# Patient Record
Sex: Male | Born: 1970
Health system: Southern US, Community
[De-identification: ages and names within clinical notes are randomized; demographics above are authoritative.]

## PROBLEM LIST (undated history)

## (undated) DIAGNOSIS — M5126 Other intervertebral disc displacement, lumbar region: Secondary | ICD-10-CM

## (undated) HISTORY — PX: WISDOM TOOTH EXTRACTION: SHX21

## (undated) HISTORY — PX: FOOT SURGERY: SHX648

---

## 2008-09-09 ENCOUNTER — Encounter: Admission: RE | Admit: 2008-09-09 | Discharge: 2008-09-09 | Payer: Self-pay | Admitting: Family Medicine

## 2010-11-08 ENCOUNTER — Emergency Department (INDEPENDENT_AMBULATORY_CARE_PROVIDER_SITE_OTHER): Payer: Worker's Compensation

## 2010-11-08 ENCOUNTER — Emergency Department (HOSPITAL_BASED_OUTPATIENT_CLINIC_OR_DEPARTMENT_OTHER)
Admission: EM | Admit: 2010-11-08 | Discharge: 2010-11-08 | Disposition: A | Discharge status: Home / Self Care | Payer: Worker's Compensation | Attending: Emergency Medicine | Admitting: Emergency Medicine

## 2010-11-08 ENCOUNTER — Encounter: Payer: Self-pay | Admitting: *Deleted

## 2010-11-08 DIAGNOSIS — M19019 Primary osteoarthritis, unspecified shoulder: Secondary | ICD-10-CM

## 2010-11-08 DIAGNOSIS — M25519 Pain in unspecified shoulder: Secondary | ICD-10-CM

## 2010-11-08 DIAGNOSIS — S40029A Contusion of unspecified upper arm, initial encounter: Secondary | ICD-10-CM | POA: Insufficient documentation

## 2010-11-08 DIAGNOSIS — S40019A Contusion of unspecified shoulder, initial encounter: Secondary | ICD-10-CM | POA: Insufficient documentation

## 2010-11-08 MED ORDER — IBUPROFEN 800 MG PO TABS: 800.0000 mg | ORAL_TABLET | Freq: Three times a day (TID) | ORAL | Status: AC

## 2010-11-08 MED ORDER — IBUPROFEN 800 MG PO TABS
ORAL_TABLET | ORAL | Status: AC
  Administered 2010-11-08: 800 mg
  Filled 2010-11-08: qty 1
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
## 2010-11-08 NOTE — ED Notes (Signed)
Sling applied to L arm shoulder with increased comfort
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
## 2010-11-08 NOTE — ED Notes (Signed)
Sling applied increased comfort with ice pack

## 2015-12-13 DIAGNOSIS — R7309 Other abnormal glucose: Secondary | ICD-10-CM | POA: Diagnosis not present

## 2015-12-13 DIAGNOSIS — E781 Pure hyperglyceridemia: Secondary | ICD-10-CM | POA: Diagnosis not present

## 2015-12-29 DIAGNOSIS — H52203 Unspecified astigmatism, bilateral: Secondary | ICD-10-CM | POA: Diagnosis not present

## 2017-01-16 ENCOUNTER — Encounter (HOSPITAL_BASED_OUTPATIENT_CLINIC_OR_DEPARTMENT_OTHER): Payer: Self-pay

## 2017-01-16 ENCOUNTER — Emergency Department (HOSPITAL_BASED_OUTPATIENT_CLINIC_OR_DEPARTMENT_OTHER): Payer: BLUE CROSS/BLUE SHIELD

## 2017-01-16 ENCOUNTER — Emergency Department (HOSPITAL_BASED_OUTPATIENT_CLINIC_OR_DEPARTMENT_OTHER)
Admission: EM | Admit: 2017-01-16 | Discharge: 2017-01-16 | Disposition: A | Discharge status: Home / Self Care | Payer: BLUE CROSS/BLUE SHIELD | Attending: Emergency Medicine | Admitting: Emergency Medicine

## 2017-01-16 DIAGNOSIS — R109 Unspecified abdominal pain: Secondary | ICD-10-CM | POA: Diagnosis not present

## 2017-01-16 DIAGNOSIS — M545 Low back pain, unspecified: Secondary | ICD-10-CM

## 2017-01-16 DIAGNOSIS — E860 Dehydration: Secondary | ICD-10-CM | POA: Insufficient documentation

## 2017-01-16 LAB — URINALYSIS, ROUTINE W REFLEX MICROSCOPIC
BILIRUBIN URINE: NEGATIVE
GLUCOSE, UA: NEGATIVE mg/dL
Hgb urine dipstick: NEGATIVE
Ketones, ur: NEGATIVE mg/dL
Leukocytes, UA: NEGATIVE
NITRITE: NEGATIVE
Protein, ur: NEGATIVE mg/dL
SPECIFIC GRAVITY, URINE: 1.025 (ref 1.005–1.030)
pH: 6.5 (ref 5.0–8.0)

## 2017-01-16 LAB — CBC WITH DIFFERENTIAL/PLATELET
Basophils Absolute: 0.1 10*3/uL (ref 0.0–0.1)
Basophils Relative: 0 %
Eosinophils Absolute: 0.2 10*3/uL (ref 0.0–0.7)
Eosinophils Relative: 2 %
HCT: 45.3 % (ref 39.0–52.0)
Hemoglobin: 16.7 g/dL (ref 13.0–17.0)
Lymphocytes Relative: 34 %
Lymphs Abs: 3.9 10*3/uL (ref 0.7–4.0)
MCH: 30.2 pg (ref 26.0–34.0)
MCHC: 36.9 g/dL — ABNORMAL HIGH (ref 30.0–36.0)
MCV: 81.9 fL (ref 78.0–100.0)
Monocytes Absolute: 0.8 10*3/uL (ref 0.1–1.0)
Monocytes Relative: 7 %
Neutro Abs: 6.6 10*3/uL (ref 1.7–7.7)
Neutrophils Relative %: 58 %
Platelets: 236 10*3/uL (ref 150–400)
RBC: 5.53 MIL/uL (ref 4.22–5.81)
RDW: 14.3 % (ref 11.5–15.5)
WBC: 11.4 10*3/uL — ABNORMAL HIGH (ref 4.0–10.5)

## 2017-01-16 LAB — BASIC METABOLIC PANEL
Anion gap: 8 (ref 5–15)
BUN: 22 mg/dL — ABNORMAL HIGH (ref 6–20)
CO2: 26 mmol/L (ref 22–32)
Calcium: 9.2 mg/dL (ref 8.9–10.3)
Chloride: 105 mmol/L (ref 101–111)
Creatinine, Ser: 1.31 mg/dL — ABNORMAL HIGH (ref 0.61–1.24)
GFR calc Af Amer: >60 mL/min (ref >60)
GFR calc non Af Amer: >60 mL/min (ref >60)
Glucose, Bld: 124 mg/dL — ABNORMAL HIGH (ref 65–99)
Potassium: 3.8 mmol/L (ref 3.5–5.1)
Sodium: 139 mmol/L (ref 135–145)

## 2017-01-16 MED ORDER — CYCLOBENZAPRINE HCL 10 MG PO TABS: 10.0000 mg | ORAL_TABLET | Freq: Every day | ORAL | 0 refills | Status: DC

## 2017-01-16 MED ORDER — MORPHINE SULFATE (PF) 4 MG/ML IV SOLN
4.0000 mg | Freq: Once | INTRAVENOUS | Status: AC
  Administered 2017-01-16: 4 mg via INTRAVENOUS
  Filled 2017-01-16: qty 1

## 2017-01-16 MED ORDER — KETOROLAC TROMETHAMINE 30 MG/ML IJ SOLN
30.0000 mg | Freq: Once | INTRAMUSCULAR | Status: AC
  Administered 2017-01-16: 30 mg via INTRAVENOUS
  Filled 2017-01-16: qty 1

## 2017-01-16 MED ORDER — HYDROMORPHONE HCL 1 MG/ML IJ SOLN
1.0000 mg | Freq: Once | INTRAMUSCULAR | Status: AC
  Administered 2017-01-16: 1 mg via INTRAVENOUS
  Filled 2017-01-16: qty 1

## 2017-01-16 MED ORDER — HYDROCODONE-ACETAMINOPHEN 5-325 MG PO TABS: 1.0000 | ORAL_TABLET | Freq: Four times a day (QID) | ORAL | 0 refills | Status: DC | PRN

## 2017-01-16 MED ORDER — PREDNISONE 50 MG PO TABS: 50.0000 mg | ORAL_TABLET | Freq: Every day | ORAL | 0 refills | Status: DC

## 2017-01-16 MED ORDER — SODIUM CHLORIDE 0.9 % IV BOLUS (SEPSIS)
1000.0000 mL | Freq: Once | INTRAVENOUS | Status: AC
  Administered 2017-01-16: 1000 mL via INTRAVENOUS

## 2017-01-16 NOTE — Discharge Instructions (Signed)
Return here as needed.  Follow up with a primary care doctor °

## 2017-01-16 NOTE — ED Triage Notes (Signed)
C/o right kidney stone x 2 days-NAD-steady gait

## 2017-01-16 NOTE — ED Notes (Signed)
ED Provider at bedside. 

## 2017-01-19 NOTE — ED Provider Notes (Signed)
[facility]-EMERGENCY DEPT Provider Note   CSN: [id number] Arrival date & time: 01/16/17  1905     History   Chief Complaint Chief Complaint  Patient presents with  . Flank Pain    HPI Todd Wheeler is a 46 y.o. male.  HPI Patient presents to the emergency department with back and flank pain that started 2 days ago.  The patient states he is concerned may be a kidney stone.  He states that certain positions make the pain worse.  He states he took some Aleve prior to arrival without significant relief of his symptomsThe patient denies chest pain, shortness of breath, headache,blurred vision, neck pain, fever, cough, weakness, numbness, dizziness, anorexia, edema, abdominal pain, nausea, vomiting, diarrhea, rash, dysuria, hematemesis, bloody stool, near syncope, or syncope. Past Medical History:  Diagnosis Date  . Kidney stone     There are no active problems to display for this patient.   Past Surgical History:  Procedure Laterality Date  . FOOT SURGERY         Home Medications    Prior to Admission medications   Medication Sig Start Date End Date Taking? Authorizing Provider  cyclobenzaprine (FLEXERIL) 10 MG tablet Take 1 tablet (10 mg total) by mouth at bedtime. 01/16/17   Deeksha Cotrell, Cristal Deer, PA-C  HYDROcodone-acetaminophen (NORCO/VICODIN) 5-325 MG tablet Take 1 tablet by mouth every 6 (six) hours as needed for moderate pain. 01/16/17   Safaa Stingley, Cristal Deer, PA-C  predniSONE (DELTASONE) 50 MG tablet Take 1 tablet (50 mg total) by mouth daily. 01/16/17   Maylynn Orzechowski, Cristal Deer, PA-C    Family History No family history on file.  Social History Social History  Substance Use Topics  . Smoking status: Never Smoker  . Smokeless tobacco: Never Used  . Alcohol use Yes     Comment: social     Allergies   Patient has no known allergies.   Review of Systems Review of Systems  All other systems negative except as documented in the HPI. All pertinent positives and negatives as  reviewed in the HPI. Physical Exam Updated Vital Signs BP 112/75 (BP Location: Right Arm)   Pulse 76   Temp 99.1 F (37.3 C) (Oral)   Resp 18   Ht  (1.803 m)   Wt 117.9 kg (260 lb)   SpO2 96%   BMI 36.26 kg/m   Physical Exam  Constitutional: He is oriented to person, place, and time. He appears well-developed and well-nourished. No distress.  HENT:  Head: Normocephalic and atraumatic.  Mouth/Throat: Oropharynx is clear and moist.  Eyes: Pupils are equal, round, and reactive to light.  Neck: Normal range of motion. Neck supple.  Cardiovascular: Normal rate, regular rhythm and normal heart sounds.  Exam reveals no gallop and no friction rub.   No murmur heard. Pulmonary/Chest: Effort normal and breath sounds normal. No respiratory distress. He has no wheezes.  Abdominal: Soft. Bowel sounds are normal. He exhibits no distension. There is no tenderness.  Neurological: He is alert and oriented to person, place, and time. He has normal strength. No sensory deficit. He exhibits normal muscle tone. Coordination and gait normal.  Skin: Skin is warm and dry. Capillary refill takes less than 2 seconds. No rash noted. No erythema.  Psychiatric: He has a normal mood and affect. His behavior is normal.  Nursing note and vitals reviewed.    ED Treatments / Results  Labs (all labs ordered are listed, but only abnormal results are displayed) Labs Reviewed  BASIC METABOLIC PANEL -  Abnormal; Notable for the following:       Result Value   Glucose, Bld 124 (*)    BUN 22 (*)    Creatinine, Ser 1.31 (*)    All other components within normal limits  CBC WITH DIFFERENTIAL/PLATELET - Abnormal; Notable for the following:    WBC 11.4 (*)    MCHC 36.9 (*)    All other components within normal limits  URINALYSIS, ROUTINE W REFLEX MICROSCOPIC    EKG  EKG Interpretation None       Radiology No results found.  Procedures Procedures (including critical care time)  Medications  Ordered in ED Medications  HYDROmorphone (DILAUDID) injection 1 mg (1 mg Intravenous Given 01/16/17 2047)  sodium chloride 0.9 % bolus 1,000 mL (0 mLs Intravenous Stopped 01/16/17 2140)  ketorolac (TORADOL) 30 MG/ML injection 30 mg (30 mg Intravenous Given 01/16/17 2140)  morphine 4 MG/ML injection 4 mg (4 mg Intravenous Given 01/16/17 2238)     Initial Impression / Assessment and Plan / ED Course  I have reviewed the triage vital signs and the nursing notes.  Pertinent labs & imaging results that were available during my care of the patient were reviewed by me and considered in my medical decision making (see chart for details).    Patient retreated for lumbar strain due the fact that there is no signs of ureteral stone at this time or infection in the urine.  Patient is advised plan and all questions were answered.  Told to follow-up with his primary doctor  Final Clinical Impressions(s) / ED Diagnoses   Final diagnoses:  Acute right-sided low back pain without sciatica  Right flank pain  Dehydration    New Prescriptions Discharge Medication List as of 01/16/2017 11:23 PM    START taking these medications   Details  cyclobenzaprine (FLEXERIL) 10 MG tablet Take 1 tablet (10 mg total) by mouth at bedtime., Starting Tue 01/16/2017, Print    HYDROcodone-acetaminophen (NORCO/VICODIN) 5-325 MG tablet Take 1 tablet by mouth every 6 (six) hours as needed for moderate pain., Starting Tue 01/16/2017, Print    predniSONE (DELTASONE) 50 MG tablet Take 1 tablet (50 mg total) by mouth daily., Starting Tue 01/16/2017, Print         Nashya Garlington, Wink, PA-C 01/19/17 0130    Vanetta Mulders, MD 01/20/17 [phone]

## 2017-09-12 DIAGNOSIS — M5441 Lumbago with sciatica, right side: Secondary | ICD-10-CM | POA: Diagnosis not present

## 2017-09-13 ENCOUNTER — Other Ambulatory Visit (HOSPITAL_BASED_OUTPATIENT_CLINIC_OR_DEPARTMENT_OTHER): Payer: Self-pay | Admitting: Family Medicine

## 2017-09-13 DIAGNOSIS — M5440 Lumbago with sciatica, unspecified side: Secondary | ICD-10-CM

## 2017-09-15 ENCOUNTER — Ambulatory Visit (HOSPITAL_BASED_OUTPATIENT_CLINIC_OR_DEPARTMENT_OTHER): Payer: BLUE CROSS/BLUE SHIELD

## 2017-09-19 DIAGNOSIS — M5127 Other intervertebral disc displacement, lumbosacral region: Secondary | ICD-10-CM | POA: Diagnosis not present

## 2017-09-19 DIAGNOSIS — M5126 Other intervertebral disc displacement, lumbar region: Secondary | ICD-10-CM | POA: Diagnosis not present

## 2017-10-26 DIAGNOSIS — M549 Dorsalgia, unspecified: Secondary | ICD-10-CM | POA: Diagnosis not present

## 2017-10-26 DIAGNOSIS — M5136 Other intervertebral disc degeneration, lumbar region: Secondary | ICD-10-CM | POA: Diagnosis not present

## 2017-10-26 DIAGNOSIS — M546 Pain in thoracic spine: Secondary | ICD-10-CM | POA: Diagnosis not present

## 2017-10-26 DIAGNOSIS — M47816 Spondylosis without myelopathy or radiculopathy, lumbar region: Secondary | ICD-10-CM | POA: Diagnosis not present

## 2017-10-26 DIAGNOSIS — M5126 Other intervertebral disc displacement, lumbar region: Secondary | ICD-10-CM | POA: Diagnosis not present

## 2017-10-26 DIAGNOSIS — M5137 Other intervertebral disc degeneration, lumbosacral region: Secondary | ICD-10-CM | POA: Diagnosis not present

## 2017-10-29 ENCOUNTER — Other Ambulatory Visit: Payer: Self-pay | Admitting: Neurosurgery

## 2017-10-31 DIAGNOSIS — Z131 Encounter for screening for diabetes mellitus: Secondary | ICD-10-CM | POA: Diagnosis not present

## 2017-11-05 NOTE — Pre-Procedure Instructions (Signed)
Jvon Meroney [facility]  11/05/2017      [id number] - [place], [place] - [address] [place] [address] Phone: [phone] Fax: [phone]    Your procedure is scheduled on  Wednesday, July 31st.  Report to [facility] Admitting at 5:30 A.M.  Call this number if you have problems the morning of surgery:  [phone]   Remember:  Do not eat or drink after midnight.     Take these medicines the morning of surgery with A SIP OF WATER: NONE  7 days prior to surgery STOP taking any Aspirin(unless otherwise instructed by your surgeon), Aleve, Naproxen, Ibuprofen, Motrin, Advil, Goody's, BC's, all herbal medications, fish oil, and all vitamins     Do not wear jewelry  Do not wear lotions, powders, or colognes, or deodorant.  Men may shave face and neck.  Do not bring valuables to the hospital.  [facility] is not responsible for any belongings or valuables.  Hearing aids, eyeglasses, contacts, dentures or bridgework may not be worn into surgery.  Leave your suitcase in the car.  After surgery it may be brought to your room.  For patients admitted to the hospital, discharge time will be determined by your treatment team.  Patients discharged the day of surgery will not be allowed to drive home.   Special instructions:   [facility]- Preparing For Surgery  Before surgery, you can play an important role. Because skin is not sterile, your skin needs to be as free of germs as possible. You can reduce the number of germs on your skin by washing with CHG (chlorahexidine gluconate) Soap before surgery.  CHG is an antiseptic cleaner which kills germs and bonds with the skin to continue killing germs even after washing.    Oral Hygiene is also important to reduce your risk of infection.  Remember - BRUSH YOUR TEETH THE MORNING OF SURGERY WITH YOUR REGULAR TOOTHPASTE  Please do not use if you have an allergy to CHG or antibacterial  soaps. If your skin becomes reddened/irritated stop using the CHG.  Do not shave (including legs and underarms) for at least 48 hours prior to first CHG shower. It is OK to shave your face.  Please follow these instructions carefully.   1. Shower the NIGHT BEFORE SURGERY and the MORNING OF SURGERY with CHG.   2. If you chose to wash your hair, wash your hair first as usual with your normal shampoo.  3. After you shampoo, rinse your hair and body thoroughly to remove the shampoo.  4. Use CHG as you would any other liquid soap. You can apply CHG directly to the skin and wash gently with a scrungie or a clean washcloth.   5. Apply the CHG Soap to your body ONLY FROM THE NECK DOWN.  Do not use on open wounds or open sores. Avoid contact with your eyes, ears, mouth and genitals (private parts). Wash Face and genitals (private parts)  with your normal soap.  6. Wash thoroughly, paying special attention to the area where your surgery will be performed.  7. Thoroughly rinse your body with warm water from the neck down.  8. DO NOT shower/wash with your normal soap after using and rinsing off the CHG Soap.  9. Pat yourself dry with a CLEAN TOWEL.  10. Wear CLEAN PAJAMAS to bed the night before surgery, wear comfortable clothes the morning of surgery  11. Place  CLEAN SHEETS on your bed the night of your first shower and DO NOT SLEEP WITH PETS.    Day of Surgery:  Do not apply any deodorants/lotions.  Please wear clean clothes to the hospital/surgery center.   Remember to brush your teeth WITH YOUR REGULAR TOOTHPASTE.   Please read over the following fact sheets that you were given.

## 2017-11-06 ENCOUNTER — Encounter (HOSPITAL_COMMUNITY)
Admission: RE | Admit: 2017-11-06 | Discharge: 2017-11-06 | Disposition: A | Discharge status: Home / Self Care | Payer: BLUE CROSS/BLUE SHIELD | Source: Ambulatory Visit | Attending: Neurosurgery | Admitting: Neurosurgery

## 2017-11-06 ENCOUNTER — Encounter (HOSPITAL_COMMUNITY): Payer: Self-pay

## 2017-11-06 ENCOUNTER — Other Ambulatory Visit: Payer: Self-pay

## 2017-11-06 DIAGNOSIS — Z01812 Encounter for preprocedural laboratory examination: Secondary | ICD-10-CM | POA: Diagnosis not present

## 2017-11-06 HISTORY — DX: Other intervertebral disc displacement, lumbar region: M51.26

## 2017-11-06 LAB — BASIC METABOLIC PANEL
Anion gap: 12 (ref 5–15)
BUN: 14 mg/dL (ref 6–20)
CO2: 24 mmol/L (ref 22–32)
Calcium: 9.7 mg/dL (ref 8.9–10.3)
Chloride: 102 mmol/L (ref 98–111)
Creatinine, Ser: 1.01 mg/dL (ref 0.61–1.24)
GFR calc Af Amer: >60 mL/min (ref >60)
GFR calc non Af Amer: >60 mL/min (ref >60)
Glucose, Bld: 120 mg/dL — ABNORMAL HIGH (ref 70–99)
Potassium: 4.1 mmol/L (ref 3.5–5.1)
Sodium: 138 mmol/L (ref 135–145)

## 2017-11-06 LAB — CBC
HCT: 49.3 % (ref 39.0–52.0)
Hemoglobin: 17.3 g/dL — ABNORMAL HIGH (ref 13.0–17.0)
MCH: 29.6 pg (ref 26.0–34.0)
MCHC: 35.1 g/dL (ref 30.0–36.0)
MCV: 84.4 fL (ref 78.0–100.0)
Platelets: 270 10*3/uL (ref 150–400)
RBC: 5.84 MIL/uL — ABNORMAL HIGH (ref 4.22–5.81)
RDW: 13.4 % (ref 11.5–15.5)
WBC: 10.8 10*3/uL — ABNORMAL HIGH (ref 4.0–10.5)

## 2017-11-06 LAB — SURGICAL PCR SCREEN
MRSA, PCR: NEGATIVE
Staphylococcus aureus: POSITIVE — AB

## 2017-11-06 NOTE — Progress Notes (Signed)
Message left to inform the pt of the pcr screen result positive for Staph. [facility]

## 2017-11-06 NOTE — Progress Notes (Signed)
[facility]  Cardiologist - Denies  Chest x-ray - Denies  EKG - Denies  Stress Test - Denies  ECHO - Denies  Cardiac Cath - Denies  Sleep Study - Denies CPAP - None  LABS- 11/06/17: CBC, BMP  ASA- Denies  HA1C- 10/31/17: 5.8 (CE)- Pt is not a diabetic  Anesthesia- No  Pt denies having chest pain, sob, or fever at this time. All instructions explained to the pt, with a verbal understanding of the material. Pt agrees to go over the instructions while at home for a better understanding. The opportunity to ask questions was provided.

## 2017-11-09 ENCOUNTER — Other Ambulatory Visit: Payer: Self-pay | Admitting: Neurosurgery

## 2017-11-13 MED ORDER — DEXTROSE 5 % IV SOLN
3.0000 g | INTRAVENOUS | Status: AC
  Administered 2017-11-14: 3 g via INTRAVENOUS
  Filled 2017-11-13 (×2): qty 3000

## 2017-11-14 ENCOUNTER — Ambulatory Visit (HOSPITAL_COMMUNITY): Payer: BLUE CROSS/BLUE SHIELD | Admitting: Anesthesiology

## 2017-11-14 ENCOUNTER — Encounter (HOSPITAL_COMMUNITY): Payer: Self-pay | Admitting: *Deleted

## 2017-11-14 ENCOUNTER — Observation Stay (HOSPITAL_COMMUNITY)
Admission: RE | Admit: 2017-11-14 | Discharge: 2017-11-15 | Disposition: A | Discharge status: Home / Self Care | Payer: BLUE CROSS/BLUE SHIELD | Source: Ambulatory Visit | Attending: Neurosurgery | Admitting: Neurosurgery

## 2017-11-14 ENCOUNTER — Encounter (HOSPITAL_COMMUNITY)
Admission: RE | Disposition: A | Discharge status: Home / Self Care | Payer: Self-pay | Source: Ambulatory Visit | Attending: Neurosurgery

## 2017-11-14 ENCOUNTER — Ambulatory Visit (HOSPITAL_COMMUNITY): Payer: BLUE CROSS/BLUE SHIELD

## 2017-11-14 DIAGNOSIS — M48061 Spinal stenosis, lumbar region without neurogenic claudication: Secondary | ICD-10-CM | POA: Diagnosis not present

## 2017-11-14 DIAGNOSIS — M5127 Other intervertebral disc displacement, lumbosacral region: Secondary | ICD-10-CM | POA: Diagnosis not present

## 2017-11-14 DIAGNOSIS — M5136 Other intervertebral disc degeneration, lumbar region: Secondary | ICD-10-CM | POA: Diagnosis not present

## 2017-11-14 DIAGNOSIS — Z981 Arthrodesis status: Secondary | ICD-10-CM | POA: Diagnosis not present

## 2017-11-14 DIAGNOSIS — M5116 Intervertebral disc disorders with radiculopathy, lumbar region: Secondary | ICD-10-CM | POA: Diagnosis not present

## 2017-11-14 DIAGNOSIS — Z791 Long term (current) use of non-steroidal anti-inflammatories (NSAID): Secondary | ICD-10-CM | POA: Insufficient documentation

## 2017-11-14 DIAGNOSIS — M5117 Intervertebral disc disorders with radiculopathy, lumbosacral region: Secondary | ICD-10-CM | POA: Diagnosis not present

## 2017-11-14 DIAGNOSIS — Z419 Encounter for procedure for purposes other than remedying health state, unspecified: Secondary | ICD-10-CM

## 2017-11-14 DIAGNOSIS — Z6837 Body mass index (BMI) 37.0-37.9, adult: Secondary | ICD-10-CM | POA: Insufficient documentation

## 2017-11-14 DIAGNOSIS — M4726 Other spondylosis with radiculopathy, lumbar region: Secondary | ICD-10-CM | POA: Diagnosis not present

## 2017-11-14 DIAGNOSIS — M5126 Other intervertebral disc displacement, lumbar region: Secondary | ICD-10-CM | POA: Diagnosis present

## 2017-11-14 HISTORY — PX: LUMBAR LAMINECTOMY/DECOMPRESSION MICRODISCECTOMY: SHX5026

## 2017-11-14 SURGERY — LUMBAR LAMINECTOMY/DECOMPRESSION MICRODISCECTOMY 1 LEVEL
Anesthesia: General | Site: Spine Lumbar | Laterality: Right

## 2017-11-14 MED ORDER — METHYLPREDNISOLONE ACETATE 80 MG/ML IJ SUSP
INTRAMUSCULAR | Status: DC | PRN
  Administered 2017-11-14: 80 mg

## 2017-11-14 MED ORDER — LIDOCAINE-EPINEPHRINE 1 %-1:100000 IJ SOLN
INTRAMUSCULAR | Status: AC
  Filled 2017-11-14: qty 1

## 2017-11-14 MED ORDER — PROPOFOL 10 MG/ML IV BOLUS
INTRAVENOUS | Status: DC | PRN
  Administered 2017-11-14 (×2): 20 mg via INTRAVENOUS
  Administered 2017-11-14: 130 mg via INTRAVENOUS
  Administered 2017-11-14: 30 mg via INTRAVENOUS

## 2017-11-14 MED ORDER — PHENOL 1.4 % MT LIQD: 1.0000 | OROMUCOSAL | Status: DC | PRN

## 2017-11-14 MED ORDER — MENTHOL 3 MG MT LOZG: 1.0000 | LOZENGE | OROMUCOSAL | Status: DC | PRN

## 2017-11-14 MED ORDER — KETOROLAC TROMETHAMINE 30 MG/ML IJ SOLN
30.0000 mg | Freq: Four times a day (QID) | INTRAMUSCULAR | Status: DC
  Administered 2017-11-14 – 2017-11-15 (×3): 30 mg via INTRAVENOUS
  Filled 2017-11-14 (×3): qty 1

## 2017-11-14 MED ORDER — ACETAMINOPHEN 325 MG PO TABS: 650.0000 mg | ORAL_TABLET | ORAL | Status: DC | PRN

## 2017-11-14 MED ORDER — ONDANSETRON HCL 4 MG/2ML IJ SOLN
INTRAMUSCULAR | Status: AC
  Filled 2017-11-14: qty 2

## 2017-11-14 MED ORDER — DEXAMETHASONE SODIUM PHOSPHATE 10 MG/ML IJ SOLN
INTRAMUSCULAR | Status: DC | PRN
  Administered 2017-11-14: 10 mg via INTRAVENOUS

## 2017-11-14 MED ORDER — OXYCODONE HCL 5 MG PO TABS: 5.0000 mg | ORAL_TABLET | Freq: Once | ORAL | Status: DC | PRN

## 2017-11-14 MED ORDER — ROCURONIUM BROMIDE 10 MG/ML (PF) SYRINGE
PREFILLED_SYRINGE | INTRAVENOUS | Status: DC | PRN
  Administered 2017-11-14: 80 mg via INTRAVENOUS

## 2017-11-14 MED ORDER — FENTANYL CITRATE (PF) 100 MCG/2ML IJ SOLN
INTRAMUSCULAR | Status: DC | PRN
  Administered 2017-11-14: 100 ug via INTRAVENOUS

## 2017-11-14 MED ORDER — SODIUM CHLORIDE 0.9 % IV SOLN: 250.0000 mL | INTRAVENOUS | Status: DC

## 2017-11-14 MED ORDER — FENTANYL CITRATE (PF) 100 MCG/2ML IJ SOLN
INTRAMUSCULAR | Status: AC
  Filled 2017-11-14: qty 2

## 2017-11-14 MED ORDER — KETOROLAC TROMETHAMINE 30 MG/ML IJ SOLN
30.0000 mg | Freq: Once | INTRAMUSCULAR | Status: AC
  Administered 2017-11-14: 30 mg via INTRAVENOUS

## 2017-11-14 MED ORDER — OXYCODONE HCL 5 MG PO TABS
ORAL_TABLET | ORAL | Status: AC
  Filled 2017-11-14: qty 2

## 2017-11-14 MED ORDER — CHLORHEXIDINE GLUCONATE CLOTH 2 % EX PADS: 6.0000 | MEDICATED_PAD | Freq: Once | CUTANEOUS | Status: DC

## 2017-11-14 MED ORDER — LIDOCAINE-EPINEPHRINE 1 %-1:100000 IJ SOLN
INTRAMUSCULAR | Status: DC | PRN
  Administered 2017-11-14: 15 mL

## 2017-11-14 MED ORDER — ALUM & MAG HYDROXIDE-SIMETH 200-200-20 MG/5ML PO SUSP
30.0000 mL | Freq: Four times a day (QID) | ORAL | Status: DC | PRN
Start: 2017-11-14 — End: 2017-11-15

## 2017-11-14 MED ORDER — SUGAMMADEX SODIUM 500 MG/5ML IV SOLN
INTRAVENOUS | Status: DC | PRN
  Administered 2017-11-14: 250 mg via INTRAVENOUS

## 2017-11-14 MED ORDER — BUPIVACAINE HCL (PF) 0.5 % IJ SOLN
INTRAMUSCULAR | Status: AC
  Filled 2017-11-14: qty 30

## 2017-11-14 MED ORDER — SODIUM CHLORIDE 0.9 % IV SOLN
INTRAVENOUS | Status: DC | PRN
  Administered 2017-11-14: 500 mL

## 2017-11-14 MED ORDER — SUGAMMADEX SODIUM 500 MG/5ML IV SOLN
INTRAVENOUS | Status: AC
  Filled 2017-11-14: qty 5

## 2017-11-14 MED ORDER — THROMBIN 5000 UNITS EX SOLR
CUTANEOUS | Status: DC | PRN
  Administered 2017-11-14 (×2): 5000 [IU] via TOPICAL

## 2017-11-14 MED ORDER — ONDANSETRON HCL 4 MG/2ML IJ SOLN
INTRAMUSCULAR | Status: DC | PRN
  Administered 2017-11-14: 4 mg via INTRAVENOUS

## 2017-11-14 MED ORDER — MIDAZOLAM HCL 5 MG/5ML IJ SOLN
INTRAMUSCULAR | Status: DC | PRN
  Administered 2017-11-14: 2 mg via INTRAVENOUS

## 2017-11-14 MED ORDER — FLEET ENEMA 7-19 GM/118ML RE ENEM: 1.0000 | ENEMA | Freq: Once | RECTAL | Status: DC | PRN

## 2017-11-14 MED ORDER — SODIUM CHLORIDE 0.9% FLUSH: 3.0000 mL | INTRAVENOUS | Status: DC | PRN

## 2017-11-14 MED ORDER — PROPOFOL 10 MG/ML IV BOLUS
INTRAVENOUS | Status: AC
  Filled 2017-11-14: qty 40

## 2017-11-14 MED ORDER — LIDOCAINE 2% (20 MG/ML) 5 ML SYRINGE
INTRAMUSCULAR | Status: DC | PRN
Start: 2017-11-14 — End: 2017-11-14
  Administered 2017-11-14: 60 mg via INTRAVENOUS

## 2017-11-14 MED ORDER — OXYCODONE HCL 5 MG/5ML PO SOLN: 5.0000 mg | Freq: Once | ORAL | Status: DC | PRN

## 2017-11-14 MED ORDER — THROMBIN 5000 UNITS EX SOLR
CUTANEOUS | Status: AC
  Filled 2017-11-14: qty 10000

## 2017-11-14 MED ORDER — BUPIVACAINE HCL (PF) 0.5 % IJ SOLN
INTRAMUSCULAR | Status: DC | PRN
  Administered 2017-11-14: 15 mL

## 2017-11-14 MED ORDER — MORPHINE SULFATE (PF) 4 MG/ML IV SOLN: 4.0000 mg | INTRAVENOUS | Status: DC | PRN

## 2017-11-14 MED ORDER — FENTANYL CITRATE (PF) 100 MCG/2ML IJ SOLN
25.0000 ug | INTRAMUSCULAR | Status: DC | PRN
  Administered 2017-11-14: 50 ug via INTRAVENOUS

## 2017-11-14 MED ORDER — LIDOCAINE 2% (20 MG/ML) 5 ML SYRINGE
INTRAMUSCULAR | Status: AC
  Filled 2017-11-14: qty 5

## 2017-11-14 MED ORDER — SODIUM CHLORIDE 0.9% FLUSH
3.0000 mL | Freq: Two times a day (BID) | INTRAVENOUS | Status: DC
  Administered 2017-11-14: 3 mL via INTRAVENOUS

## 2017-11-14 MED ORDER — HYDROCODONE-ACETAMINOPHEN 5-325 MG PO TABS: 1.0000 | ORAL_TABLET | ORAL | Status: DC | PRN

## 2017-11-14 MED ORDER — ACETAMINOPHEN 650 MG RE SUPP: 650.0000 mg | RECTAL | Status: DC | PRN

## 2017-11-14 MED ORDER — ACETAMINOPHEN 10 MG/ML IV SOLN
INTRAVENOUS | Status: AC
  Filled 2017-11-14: qty 100

## 2017-11-14 MED ORDER — ACETAMINOPHEN 10 MG/ML IV SOLN
1000.0000 mg | Freq: Once | INTRAVENOUS | Status: AC
  Administered 2017-11-14: 1000 mg via INTRAVENOUS

## 2017-11-14 MED ORDER — LACTATED RINGERS IV SOLN
INTRAVENOUS | Status: DC | PRN
  Administered 2017-11-14 (×2): via INTRAVENOUS

## 2017-11-14 MED ORDER — ROCURONIUM BROMIDE 10 MG/ML (PF) SYRINGE
PREFILLED_SYRINGE | INTRAVENOUS | Status: AC
  Filled 2017-11-14: qty 10

## 2017-11-14 MED ORDER — METHYLPREDNISOLONE ACETATE 80 MG/ML IJ SUSP
INTRAMUSCULAR | Status: AC
  Filled 2017-11-14: qty 1

## 2017-11-14 MED ORDER — MIDAZOLAM HCL 2 MG/2ML IJ SOLN
INTRAMUSCULAR | Status: AC
  Filled 2017-11-14: qty 2

## 2017-11-14 MED ORDER — HYDROXYZINE HCL 25 MG PO TABS: 50.0000 mg | ORAL_TABLET | ORAL | Status: DC | PRN

## 2017-11-14 MED ORDER — CYCLOBENZAPRINE HCL 5 MG PO TABS: 5.0000 mg | ORAL_TABLET | Freq: Three times a day (TID) | ORAL | Status: DC | PRN

## 2017-11-14 MED ORDER — HYDROXYZINE HCL 50 MG/ML IM SOLN: 50.0000 mg | INTRAMUSCULAR | Status: DC | PRN

## 2017-11-14 MED ORDER — BISACODYL 10 MG RE SUPP: 10.0000 mg | Freq: Every day | RECTAL | Status: DC | PRN

## 2017-11-14 MED ORDER — SUCCINYLCHOLINE CHLORIDE 200 MG/10ML IV SOSY
PREFILLED_SYRINGE | INTRAVENOUS | Status: AC
  Filled 2017-11-14: qty 10

## 2017-11-14 MED ORDER — FENTANYL CITRATE (PF) 100 MCG/2ML IJ SOLN
INTRAMUSCULAR | Status: DC | PRN
  Administered 2017-11-14: 50 ug via INTRAVENOUS
  Administered 2017-11-14: 150 ug via INTRAVENOUS

## 2017-11-14 MED ORDER — FENTANYL CITRATE (PF) 250 MCG/5ML IJ SOLN
INTRAMUSCULAR | Status: AC
  Filled 2017-11-14: qty 5

## 2017-11-14 MED ORDER — KETOROLAC TROMETHAMINE 30 MG/ML IJ SOLN
INTRAMUSCULAR | Status: AC
  Filled 2017-11-14: qty 1

## 2017-11-14 MED ORDER — 0.9 % SODIUM CHLORIDE (POUR BTL) OPTIME
TOPICAL | Status: DC | PRN
  Administered 2017-11-14: 1000 mL

## 2017-11-14 MED ORDER — HEMOSTATIC AGENTS (NO CHARGE) OPTIME
TOPICAL | Status: DC | PRN
  Administered 2017-11-14: 1 via TOPICAL

## 2017-11-14 MED ORDER — MAGNESIUM HYDROXIDE 400 MG/5ML PO SUSP: 30.0000 mL | Freq: Every day | ORAL | Status: DC | PRN

## 2017-11-14 MED ORDER — KCL IN DEXTROSE-NACL 20-5-0.45 MEQ/L-%-% IV SOLN: INTRAVENOUS | Status: DC

## 2017-11-14 SURGICAL SUPPLY — 58 items
BAG DECANTER FOR FLEXI CONT (MISCELLANEOUS) ×2 IMPLANT
BENZOIN TINCTURE PRP APPL 2/3 (GAUZE/BANDAGES/DRESSINGS) IMPLANT
BLADE CLIPPER SURG (BLADE) IMPLANT
BUR ACRON 5.0MM COATED (BURR) ×2 IMPLANT
BUR MATCHSTICK NEURO 3.0 LAGG (BURR) ×2 IMPLANT
CANISTER SUCT 3000ML PPV (MISCELLANEOUS) ×2 IMPLANT
CARTRIDGE OIL MAESTRO DRILL (MISCELLANEOUS) ×1 IMPLANT
DECANTER SPIKE VIAL GLASS SM (MISCELLANEOUS) IMPLANT
DERMABOND ADVANCED (GAUZE/BANDAGES/DRESSINGS) ×1
DERMABOND ADVANCED .7 DNX12 (GAUZE/BANDAGES/DRESSINGS) ×1 IMPLANT
DIFFUSER DRILL AIR PNEUMATIC (MISCELLANEOUS) ×2 IMPLANT
DRAPE LAPAROTOMY 100X72X124 (DRAPES) ×2 IMPLANT
DRAPE MICROSCOPE LEICA (MISCELLANEOUS) ×2 IMPLANT
DRAPE POUCH INSTRU U-SHP 10X18 (DRAPES) ×2 IMPLANT
ELECT REM PT RETURN 9FT ADLT (ELECTROSURGICAL) ×2
ELECTRODE REM PT RTRN 9FT ADLT (ELECTROSURGICAL) ×1 IMPLANT
GAUZE SPONGE 4X4 12PLY STRL (GAUZE/BANDAGES/DRESSINGS) IMPLANT
GAUZE SPONGE 4X4 16PLY XRAY LF (GAUZE/BANDAGES/DRESSINGS) IMPLANT
GLOVE BIOGEL PI IND STRL 7.0 (GLOVE) ×1 IMPLANT
GLOVE BIOGEL PI IND STRL 7.5 (GLOVE) ×1 IMPLANT
GLOVE BIOGEL PI IND STRL 8 (GLOVE) ×1 IMPLANT
GLOVE BIOGEL PI INDICATOR 7.0 (GLOVE) ×1
GLOVE BIOGEL PI INDICATOR 7.5 (GLOVE) ×1
GLOVE BIOGEL PI INDICATOR 8 (GLOVE) ×1
GLOVE ECLIPSE 7.5 STRL STRAW (GLOVE) ×2 IMPLANT
GLOVE EXAM NITRILE LRG STRL (GLOVE) IMPLANT
GLOVE EXAM NITRILE XL STR (GLOVE) IMPLANT
GLOVE EXAM NITRILE XS STR PU (GLOVE) IMPLANT
GLOVE SURG SS PI 7.0 STRL IVOR (GLOVE) ×8 IMPLANT
GOWN STRL REUS W/ TWL LRG LVL3 (GOWN DISPOSABLE) ×1 IMPLANT
GOWN STRL REUS W/ TWL XL LVL3 (GOWN DISPOSABLE) ×1 IMPLANT
GOWN STRL REUS W/TWL 2XL LVL3 (GOWN DISPOSABLE) IMPLANT
GOWN STRL REUS W/TWL LRG LVL3 (GOWN DISPOSABLE) ×1
GOWN STRL REUS W/TWL XL LVL3 (GOWN DISPOSABLE) ×1
KIT BASIN OR (CUSTOM PROCEDURE TRAY) ×2 IMPLANT
KIT TURNOVER KIT B (KITS) ×2 IMPLANT
NEEDLE HYPO 18GX1.5 BLUNT FILL (NEEDLE) IMPLANT
NEEDLE SPNL 18GX3.5 QUINCKE PK (NEEDLE) ×2 IMPLANT
NEEDLE SPNL 22GX3.5 QUINCKE BK (NEEDLE) ×2 IMPLANT
NS IRRIG 1000ML POUR BTL (IV SOLUTION) ×2 IMPLANT
OIL CARTRIDGE MAESTRO DRILL (MISCELLANEOUS) ×2
PACK LAMINECTOMY NEURO (CUSTOM PROCEDURE TRAY) ×2 IMPLANT
PAD ARMBOARD 7.5X6 YLW CONV (MISCELLANEOUS) ×10 IMPLANT
PATTIES SURGICAL .5 X1 (DISPOSABLE) IMPLANT
RUBBERBAND STERILE (MISCELLANEOUS) ×4 IMPLANT
SPONGE LAP 4X18 RFD (DISPOSABLE) IMPLANT
SPONGE SURGIFOAM ABS GEL SZ50 (HEMOSTASIS) ×2 IMPLANT
STRIP CLOSURE SKIN 1/2X4 (GAUZE/BANDAGES/DRESSINGS) IMPLANT
SUT PROLENE 6 0 BV (SUTURE) IMPLANT
SUT VIC AB 1 CT1 18XBRD ANBCTR (SUTURE) ×2 IMPLANT
SUT VIC AB 1 CT1 8-18 (SUTURE) ×2
SUT VIC AB 2-0 CP2 18 (SUTURE) ×4 IMPLANT
SUT VIC AB 3-0 SH 8-18 (SUTURE) IMPLANT
SYR 5ML LL (SYRINGE) IMPLANT
TAPE CLOTH SURG 4X10 WHT LF (GAUZE/BANDAGES/DRESSINGS) ×2 IMPLANT
TOWEL GREEN STERILE (TOWEL DISPOSABLE) ×2 IMPLANT
TOWEL GREEN STERILE FF (TOWEL DISPOSABLE) ×2 IMPLANT
WATER STERILE IRR 1000ML POUR (IV SOLUTION) ×2 IMPLANT
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
## 2017-11-14 NOTE — Progress Notes (Signed)
Vitals:   11/14/17 1145 11/14/17 1217 11/14/17 1623 11/14/17 1952  BP: 121/81 (!) 114/55 (!) 94/49 (!) 102/59  Pulse: 85 (!) 103 (!) 105 (!) 106  Resp: 13 18 18 20   Temp: 97.8 F (36.6 C) 98.8 F (37.1 C) 98.4 F (36.9 C) 99.1 F (37.3 C)  TempSrc:  Oral Oral Oral  SpO2: 94% 98% 96% 95%  Weight:        Patient with excellent relief of disabling radicular pain.  Has been up and ambulating actively.  Voiding well.  Dressing clean and dry, nursing staff to remove in a.m.  Spoke with the patient and his mother regarding activities and wound care following discharge.  Plan: Courage to continue to ambulate in the halls.  Continue to progress through postoperative recovery.  Hewitt ShortsNUDELMAN,ROBERT W, MD 11/14/2017, 8:25 PM

## 2017-11-14 NOTE — Transfer of Care (Signed)
Immediate Anesthesia Transfer of Care Note  [facility]  Procedure(s) Performed: LUMBAR FIVE- SACRAL ONE LUMBAR LAMINOTOMY AND MICRODISCECTOMY (Right Spine Lumbar)  Patient Location: PACU  Anesthesia Type:General  Level of Consciousness: oriented, drowsy and patient cooperative  Airway & Oxygen Therapy: Patient Spontanous Breathing and Patient connected to face mask oxygen  Post-op Assessment: Report given to RN and Post -op Vital signs reviewed and stable  Post vital signs: Reviewed  Last Vitals:  Vitals Value Taken Time  BP 128/76 11/14/2017 10:01 AM  Temp    Pulse 100 11/14/2017 10:04 AM  Resp 15 11/14/2017 10:04 AM  SpO2 100 % 11/14/2017 10:04 AM  Vitals shown include unvalidated device data.  Last Pain:  Vitals:   11/14/17 0559  TempSrc:   PainSc: 4          Complications: No apparent anesthesia complications

## 2017-11-14 NOTE — Anesthesia Procedure Notes (Signed)
Procedure Name: Intubation Date/Time: 11/14/2017 7:38 AM Performed by: Lovie Cholock, Vassie Kugel K, CRNA Pre-anesthesia Checklist: Patient identified, Emergency Drugs available, Suction available and Patient being monitored Patient Re-evaluated:Patient Re-evaluated prior to induction Oxygen Delivery Method: Circle System Utilized Preoxygenation: Pre-oxygenation with 100% oxygen Induction Type: IV induction Ventilation: Mask ventilation without difficulty Laryngoscope Size: Miller and 3 Grade View: Grade I Tube type: Oral Tube size: 7.5 mm Number of attempts: 1 Airway Equipment and Method: Stylet and Oral airway Placement Confirmation: ETT inserted through vocal cords under direct vision,  positive ETCO2 and breath sounds checked- equal and bilateral Secured at: 21 cm Tube secured with: Tape Dental Injury: Teeth and Oropharynx as per pre-operative assessment

## 2017-11-14 NOTE — Anesthesia Preprocedure Evaluation (Addendum)
Anesthesia Evaluation  Patient identified by MRN, date of birth, ID band Patient awake    Reviewed: Allergy & Precautions, NPO status , Patient's Chart, lab work & pertinent test results  Airway Mallampati: III  TM Distance: >3 FB Neck ROM: Full    Dental  (+) Teeth Intact, Dental Advisory Given   Pulmonary neg pulmonary ROS,    breath sounds clear to auscultation       Cardiovascular negative cardio ROS   Rhythm:Regular     Neuro/Psych  Neuromuscular disease negative psych ROS   GI/Hepatic negative GI ROS, Neg liver ROS,   Endo/Other  Morbid obesity  Renal/GU negative Renal ROS     Musculoskeletal negative musculoskeletal ROS (+)   Abdominal   Peds  Hematology negative hematology ROS (+)   Anesthesia Other Findings   Reproductive/Obstetrics                            Anesthesia Physical Anesthesia Plan  ASA: II  Anesthesia Plan: General   Post-op Pain Management:    Induction: Intravenous  PONV Risk Score and Plan: 2 and Ondansetron and Dexamethasone  Airway Management Planned: Oral ETT  Additional Equipment: None  Intra-op Plan:   Post-operative Plan: Extubation in OR  Informed Consent: I have reviewed the patients History and Physical, chart, labs and discussed the procedure including the risks, benefits and alternatives for the proposed anesthesia with the patient or authorized representative who has indicated his/her understanding and acceptance.   Dental advisory given  Plan Discussed with: CRNA and Surgeon  Anesthesia Plan Comments:         Anesthesia Quick Evaluation

## 2017-11-14 NOTE — Op Note (Signed)
11/14/2017  9:30 AM  PATIENT:  Todd Wheeler  47 y.o. male  PRE-OPERATIVE DIAGNOSIS: Right L5-S1 lumbar disc herniation, lumbar spondylosis, lumbar degenerative disease, lumbar stenosis, right lumbar radiculopathy, lumbago  POST-OPERATIVE DIAGNOSIS:  Right L5-S1 lumbar disc herniation, lumbar spondylosis, lumbar degenerative disease, lumbar stenosis, right lumbar radiculopathy, lumbago  PROCEDURE:  Procedure(s): Right L5-S1 lumbar laminotomy and microdiscectomy, with microdissection, microsurgical technique, and the operating microscope  SURGEON:  Surgeon(s): Shirlean KellyNudelman, Robert, MD  ANESTHESIA:   general  EBL:  Total I/O In: 1000 [I.V.:1000] Out: 25 [Blood:25]  BLOOD ADMINISTERED:none  COUNT:  Correct per nursing staff  DICTATION: Patient was brought to the operating room and placed under general endotracheal anesthesia. Patient was turned to prone position the lumbar region was prepped with Betadine soap and solution and draped in a sterile fashion. The midline was infiltrated with local anesthetic with epinephrine. A localizing x-ray was taken and the L5-S1 level was identified. Midline incision was made over the L5-S1 level and was carried down through the subcutaneous tissue to the lumbar fascia. The lumbar fascia was incised on the right side and the paraspinal muscles were dissected from the spinous processes and lamina in a subperiosteal fashion. Another x-ray was taken and the right L5-S1 intralaminar space was identified. The operating microscope was draped and brought into the field provided additional magnification, illumination, and visualization. Laminotomy was performed using the high-speed drill and Kerrison punches. The ligamentum flavum was carefully resected. The underlying thecal sac and nerve root were identified. The disc herniation was identified and the thecal sac and nerve root gently retracted medially.  We found a large partially calcified spondylitic disc herniation.   As we continued to explore the epidural space a free fragment disc herniation was encountered.  The free fragment was removed, and then we continued the discectomy by removing the the right sided portion extending towards the midline of the spondylitic disc herniation.  In the end all loose fragments of disc material removed, and good decompression of the thecal sac and nerve root was achieved.  We then established hemostasis with the use of bipolar cautery and Gelfoam with thrombin. The Gelfoam was removed and hemostasis confirmed. We then instilled 2 cc of fentanyl and 80 mg of Depo-Medrol into the epidural space. Deep fascia was closed with interrupted undyed 1 Vicryl sutures. Scarpa's fascia was closed with interrupted undyed 1 Vicryl sutures in the subcutaneous and subcuticular layer were closed with interrupted inverted 2-0 undyed Vicryl sutures. The skin edges were approximated with Dermabond. Following surgery the patient was turned back to a supine position to be reversed from the anesthetic extubated and transferred to the recovery room for further care.   PLAN OF CARE: Admit for overnight observation  PATIENT DISPOSITION:  PACU - hemodynamically stable.   Delay start of Pharmacological VTE agent (>24hrs) due to surgical blood loss or risk of bleeding:  yes

## 2017-11-15 ENCOUNTER — Encounter (HOSPITAL_COMMUNITY): Payer: Self-pay | Admitting: Neurosurgery

## 2017-11-15 DIAGNOSIS — M48061 Spinal stenosis, lumbar region without neurogenic claudication: Secondary | ICD-10-CM | POA: Diagnosis not present

## 2017-11-15 DIAGNOSIS — Z791 Long term (current) use of non-steroidal anti-inflammatories (NSAID): Secondary | ICD-10-CM | POA: Diagnosis not present

## 2017-11-15 DIAGNOSIS — M5116 Intervertebral disc disorders with radiculopathy, lumbar region: Secondary | ICD-10-CM | POA: Diagnosis not present

## 2017-11-15 DIAGNOSIS — M4726 Other spondylosis with radiculopathy, lumbar region: Secondary | ICD-10-CM | POA: Diagnosis not present

## 2017-11-15 DIAGNOSIS — Z6837 Body mass index (BMI) 37.0-37.9, adult: Secondary | ICD-10-CM | POA: Diagnosis not present

## 2017-11-15 MED ORDER — HYDROCODONE-ACETAMINOPHEN 5-325 MG PO TABS: 1.0000 | ORAL_TABLET | ORAL | 0 refills | Status: AC | PRN

## 2017-11-15 NOTE — Anesthesia Postprocedure Evaluation (Signed)
Anesthesia Post Note  Patient: Todd Wheeler  Procedure(s) Performed: LUMBAR FIVE- SACRAL ONE LUMBAR LAMINOTOMY AND MICRODISCECTOMY (Right Spine Lumbar)     Patient location during evaluation: PACU Anesthesia Type: General Level of consciousness: awake and alert Pain management: pain level controlled Vital Signs Assessment: post-procedure vital signs reviewed and stable Respiratory status: spontaneous breathing, nonlabored ventilation, respiratory function stable and patient connected to nasal cannula oxygen Cardiovascular status: blood pressure returned to baseline and stable Postop Assessment: no apparent nausea or vomiting Anesthetic complications: no    Last Vitals:  Vitals:   11/15/17 0347 11/15/17 1022  BP: (!) 101/57 102/62  Pulse: 92 89  Resp: 20 18  Temp: 36.9 C 36.9 C  SpO2: 95% 97%    Last Pain:  Vitals:   11/15/17 1022  TempSrc: Oral  PainSc:                  Todd Wheeler

## 2017-11-15 NOTE — Discharge Summary (Signed)
Physician Discharge Summary  Patient ID: Todd Wheeler MRN: [id number] DOB/AGE: September 27, 1970 47 y.o.  Admit date: 11/14/2017 Discharge date: 11/15/2017  Admission Diagnoses:  Right L5-S1 lumbar disc herniation, lumbar spondylosis, lumbar degenerative disease, lumbar stenosis, right lumbar radiculopathy, lumbago  Discharge Diagnoses:  Right L5-S1 lumbar disc herniation, lumbar spondylosis, lumbar degenerative disease, lumbar stenosis, right lumbar radiculopathy, lumbago  Active Problems:   HNP (herniated nucleus pulposus), lumbar   Discharged Condition: good  Hospital Course: Patient was admitted, underwent right L5-S1 lumbar laminotomy microdiscectomy.  Postoperatively he has had excellent relief of his disabling back pain.  His incision is healing nicely.  He is up and ambulate actively.  He is voiding well.  We are discharging him to home with instructions regarding wound care and activities.  He is scheduled for follow-up with me in the office in 3 weeks.  Discharge Exam: Blood pressure 102/62, pulse 89, temperature 98.4 F (36.9 C), temperature source Oral, resp. rate 18, weight 120.7 kg (266 lb), SpO2 97 %.  Disposition: Discharge disposition: 01-Home or Self Care       Discharge Instructions    Discharge wound care:   Complete by:  As directed    Leave the wound open to air. Shower daily with the wound uncovered. Water and soapy water should run over the incision area. Do not wash directly on the incision for 2 weeks. Remove the glue after 2 weeks.   Driving Restrictions   Complete by:  As directed    No driving for 2 weeks. May ride in the car locally now. May begin to drive locally in 2 weeks.   Other Restrictions   Complete by:  As directed    Walk gradually increasing distances out in the fresh air at least twice a day. Walking additional 6 times inside the house, gradually increasing distances, daily. No bending, lifting, or twisting. Perform activities between shoulder  and waist height (that is at counter height when standing or table height when sitting).     Allergies as of 11/15/2017   No Known Allergies     Medication List    TAKE these medications   HYDROcodone-acetaminophen 5-325 MG tablet Commonly known as:  NORCO/VICODIN Take 1-2 tablets by mouth every 4 (four) hours as needed (pain).   ibuprofen 200 MG tablet Commonly known as:  ADVIL,MOTRIN Take 800 mg by mouth every 8 (eight) hours as needed (for pain.).            Discharge Care Instructions  (From admission, onward)        Start     Ordered   11/15/17 0000  Discharge wound care:    Comments:  Leave the wound open to air. Shower daily with the wound uncovered. Water and soapy water should run over the incision area. Do not wash directly on the incision for 2 weeks. Remove the glue after 2 weeks.   11/15/17 1234       Signed: Hewitt ShortsNUDELMAN,ROBERT W 11/15/2017, 12:34 PM

## 2017-11-15 NOTE — Discharge Instructions (Signed)
If provided with back brace, wear when out of bed.  It is not necessary to wear in bed. Diet Resume your normal diet.  Return to Work Will be discussed at you follow up appointment. Call Your Doctor If Any of These Occur Redness, drainage, or swelling at the wound.  Temperature greater than 101 degrees. Severe pain not relieved by pain medication. Incision starts to come apart. Follow Up Appt Call today for appointment in 3 weeks ([phone]) or for problems.  If you have any hardware placed in your spine, you will need an x-ray before your appointment.

## 2017-11-15 NOTE — Progress Notes (Signed)
Patient alert and oriented, mae's well, voiding adequate amount of urine, swallowing without difficulty, no c/o pain at time of discharge. Patient discharged home with family. Script and discharged instructions given to patient. Patient and family stated understanding of instructions given. Patient has an appointment with Dr. Nudelman 

## 2018-05-25 IMAGING — CT CT RENAL STONE PROTOCOL
2 of 4 series · 17 of 46 positions shown, 19 images · non-contrast
Comparison: None.

CLINICAL DATA: Flank pain

EXAM:
CT ABDOMEN AND PELVIS WITHOUT CONTRAST
TECHNIQUE: Multidetector CT imaging of the abdomen and pelvis was performed
following the standard protocol without IV contrast.

[Series 2: axial st · axial · 0.98mm/px · z∈[+687,+1212]mm · 14 of 115 slices shown, 16 images]
[im 5/115  soft-tissue]
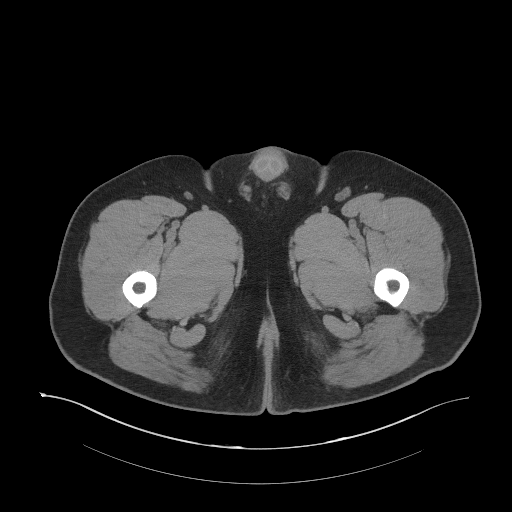
[im 5/115  bone]
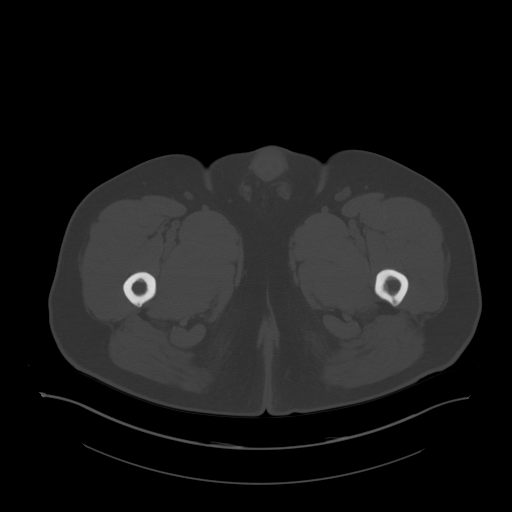
[im 15/115  soft-tissue]
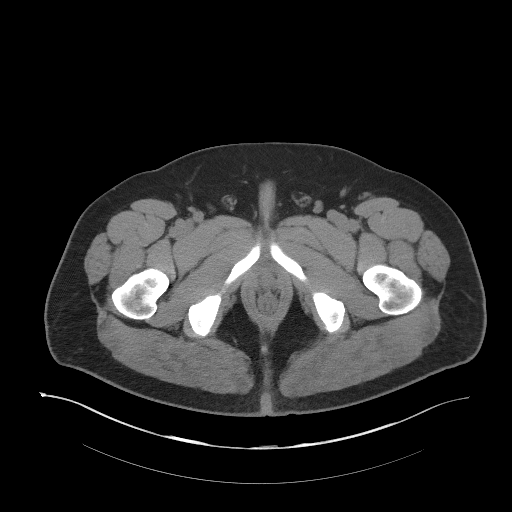
[im 24/115  soft-tissue]
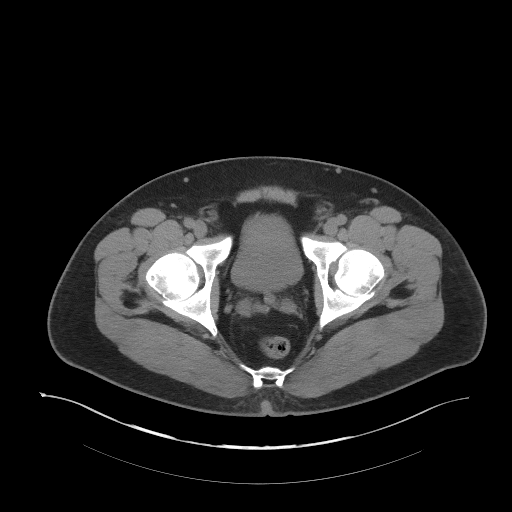
[im 29/115  soft-tissue]
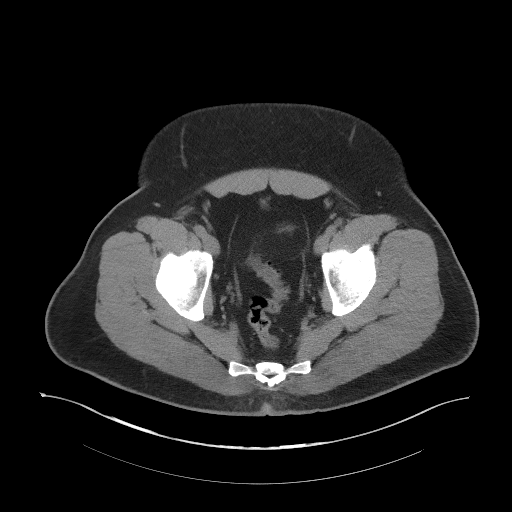
[im 39/115  soft-tissue]
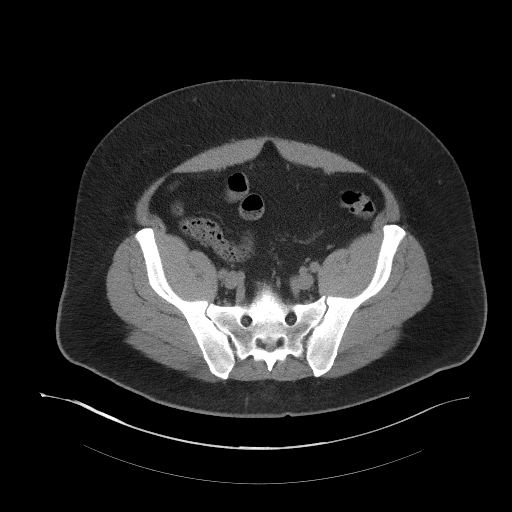
[im 48/115  soft-tissue]
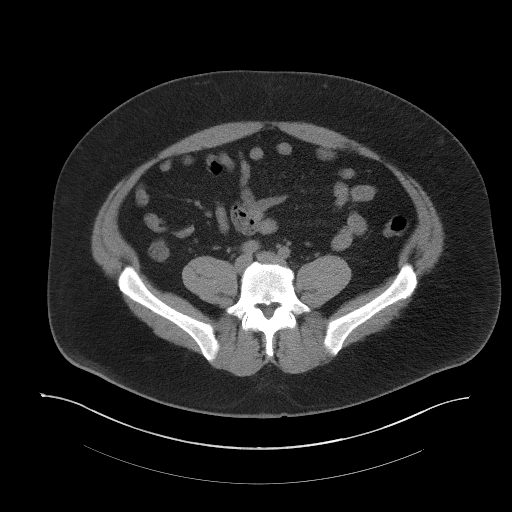
[im 53/115  soft-tissue]
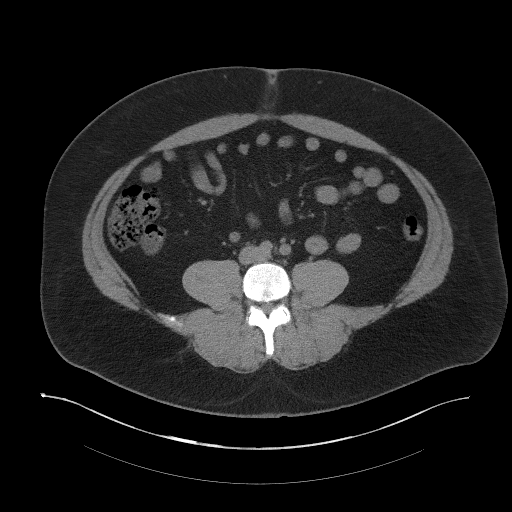
[im 62/115  soft-tissue]
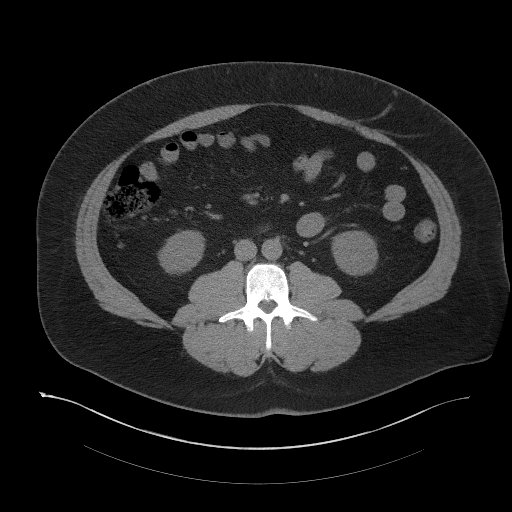
[im 67/115  soft-tissue]
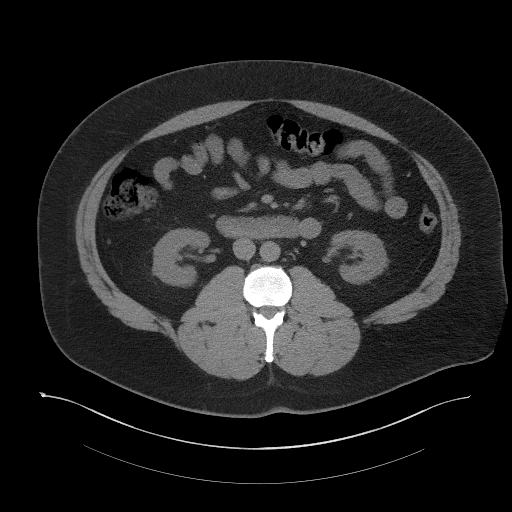
[im 67/115  bone]
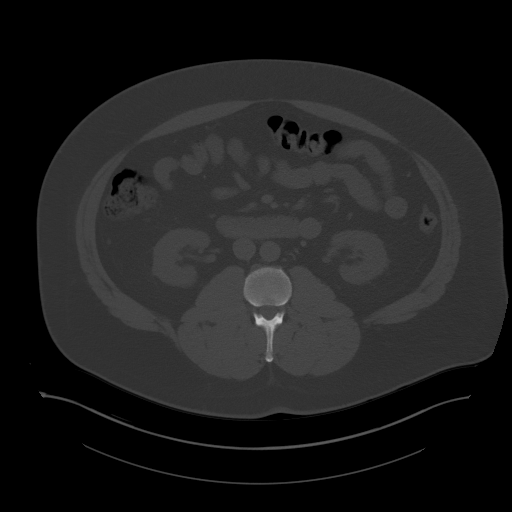
[im 77/115  soft-tissue]
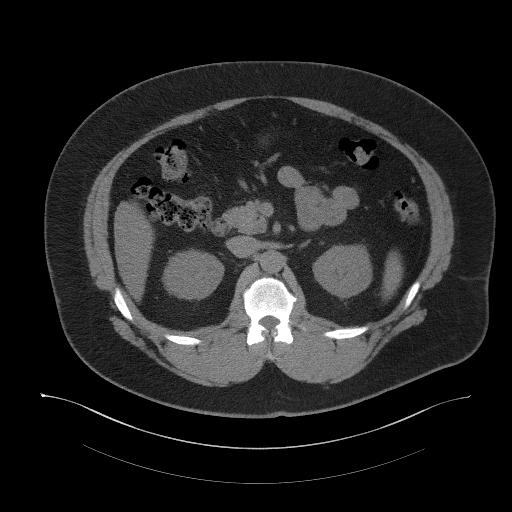
[im 86/115  soft-tissue]
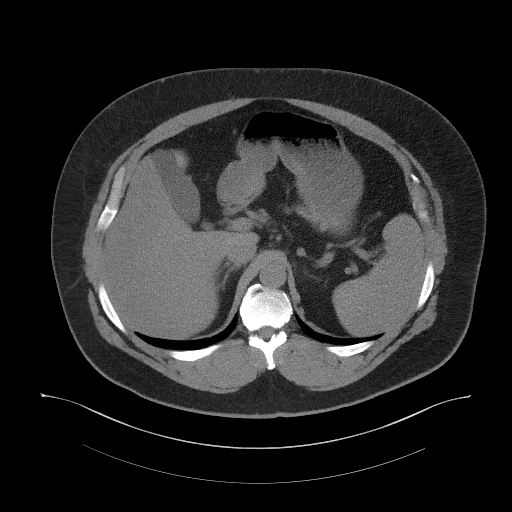
[im 91/115  soft-tissue]
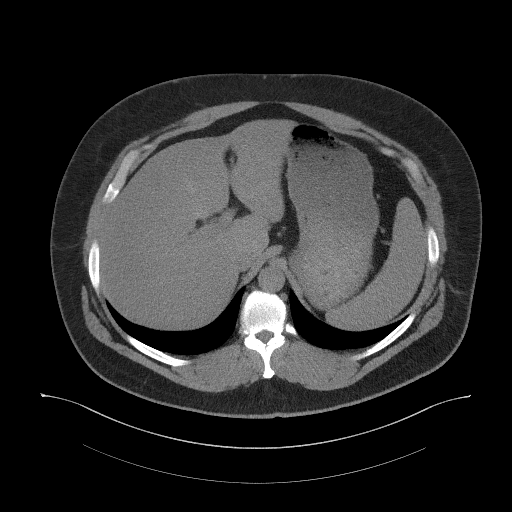
[im 100/115  soft-tissue]
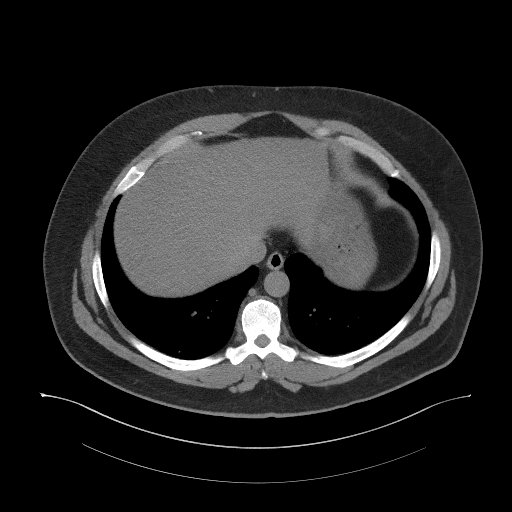
[im 110/115  soft-tissue]
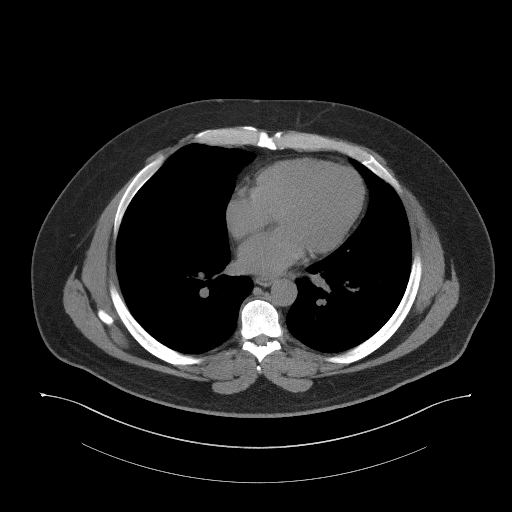

[Series 5: coronal st · coronal · 1.10mm/px · 3 of 109 slices shown]
[im 37/109  soft-tissue]
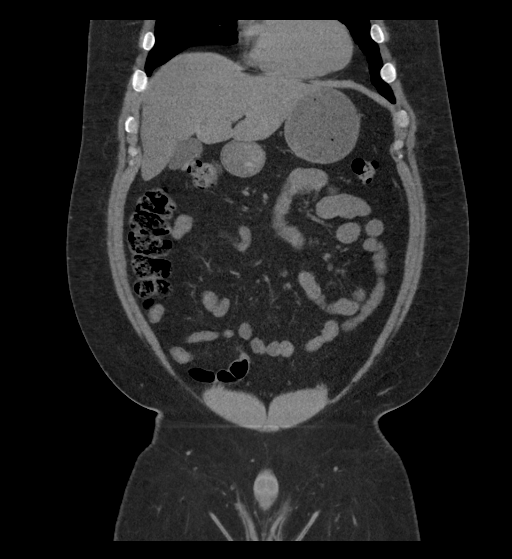
[im 49/109  soft-tissue]
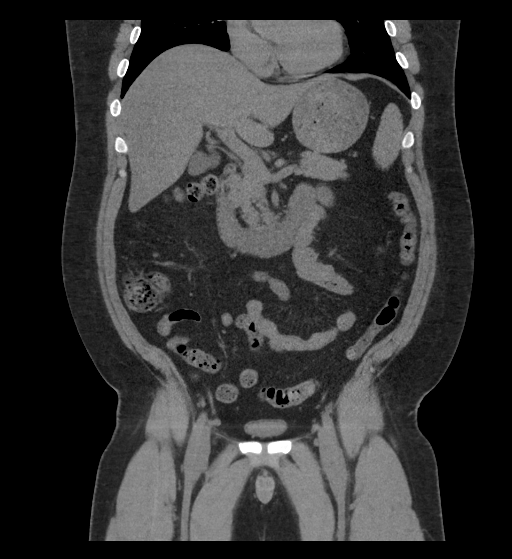
[im 61/109  soft-tissue]
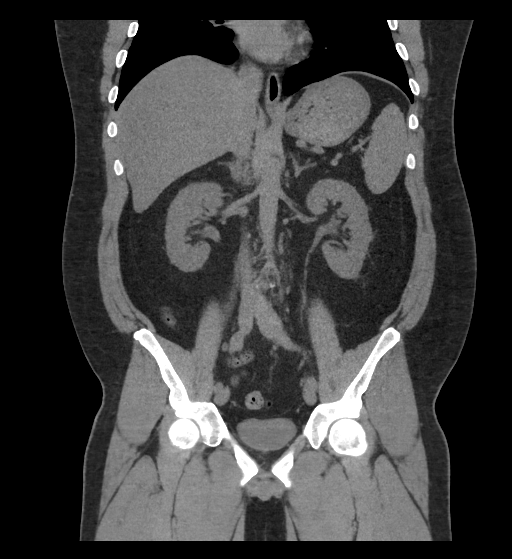

[17 of 46 positions shown; findings below may reference images not displayed]

FINDINGS: Lower chest: No pulmonary nodules or pleural effusion. No visible
pericardial effusion.

Hepatobiliary: Normal hepatic contours and density. No visible
biliary dilatation. Normal gallbladder.

Pancreas: Normal contours without ductal dilatation. No
peripancreatic fluid collection.

Spleen: Normal.

Adrenals/Urinary Tract:

--Adrenal glands: Normal.

--Right kidney/ureter: No hydronephrosis or perinephric stranding.
No nephrolithiasis. No obstructing ureteral stones.

--Left kidney/ureter: No hydronephrosis or perinephric stranding. No
nephrolithiasis. No obstructing ureteral stones.

--Urinary bladder: Unremarkable.

Stomach/Bowel:

--Stomach/Duodenum: No hiatal hernia or other gastric abnormality.
Normal duodenal course and caliber.

--Small bowel: No dilatation or inflammation.

--Colon: No focal abnormality.

--Appendix: Normal.

Vascular/Lymphatic: Normal course and caliber of the major abdominal
vessels. No abdominal or pelvic lymphadenopathy.

Reproductive: Normal prostate and seminal vesicles.

Musculoskeletal. No bony spinal canal stenosis or focal osseous
abnormality.

Other: None.
IMPRESSION: No obstructive uropathy or other acute abdominopelvic abnormality.

## 2018-07-08 DIAGNOSIS — J0140 Acute pansinusitis, unspecified: Secondary | ICD-10-CM | POA: Diagnosis not present

## 2018-09-24 DIAGNOSIS — Z131 Encounter for screening for diabetes mellitus: Secondary | ICD-10-CM | POA: Diagnosis not present

## 2019-03-23 IMAGING — CR DG LUMBAR SPINE 2-3V
2 series · 2 of 2 positions shown · non-contrast
Comparison: Lumbar spine series of October 26, 2017

CLINICAL DATA: Two localization radiographs prior to L5-S1
laminotomy and microdiskectomy.

EXAM:
LUMBAR SPINE - 2-3 VIEW

[xtable lateral]
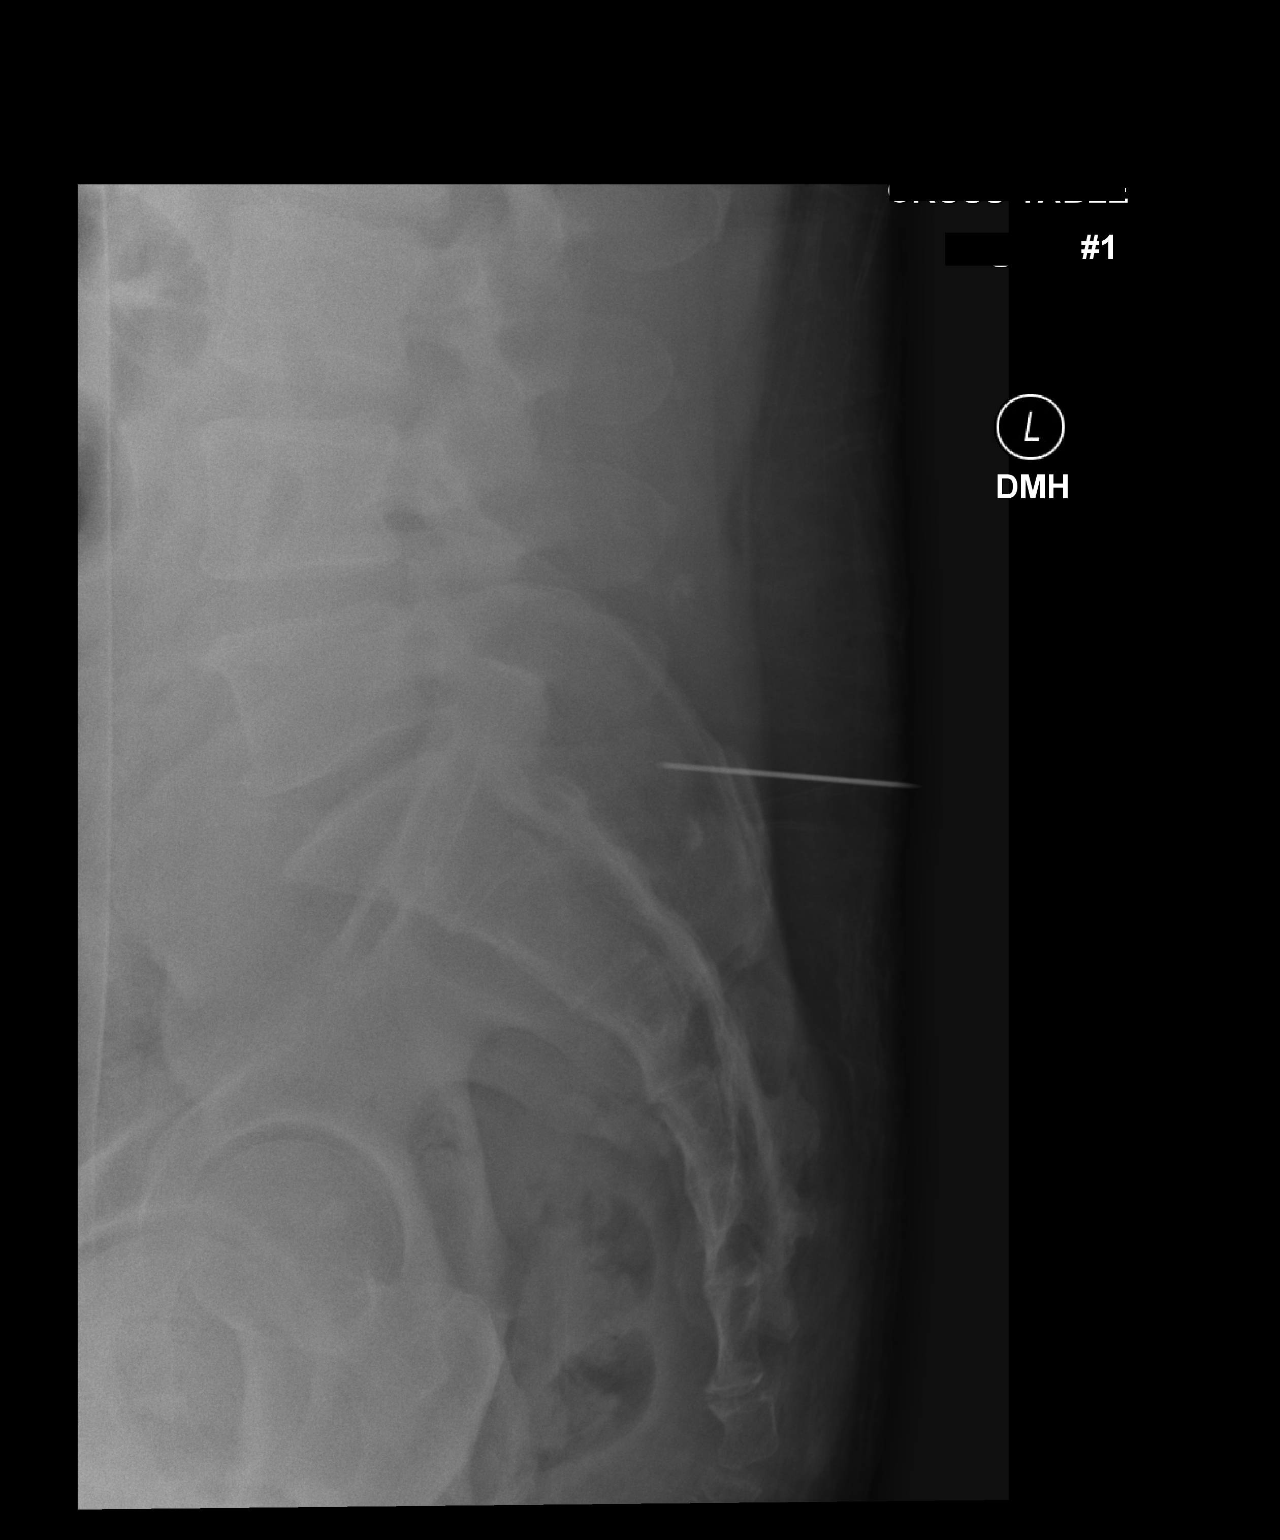

[lateral]
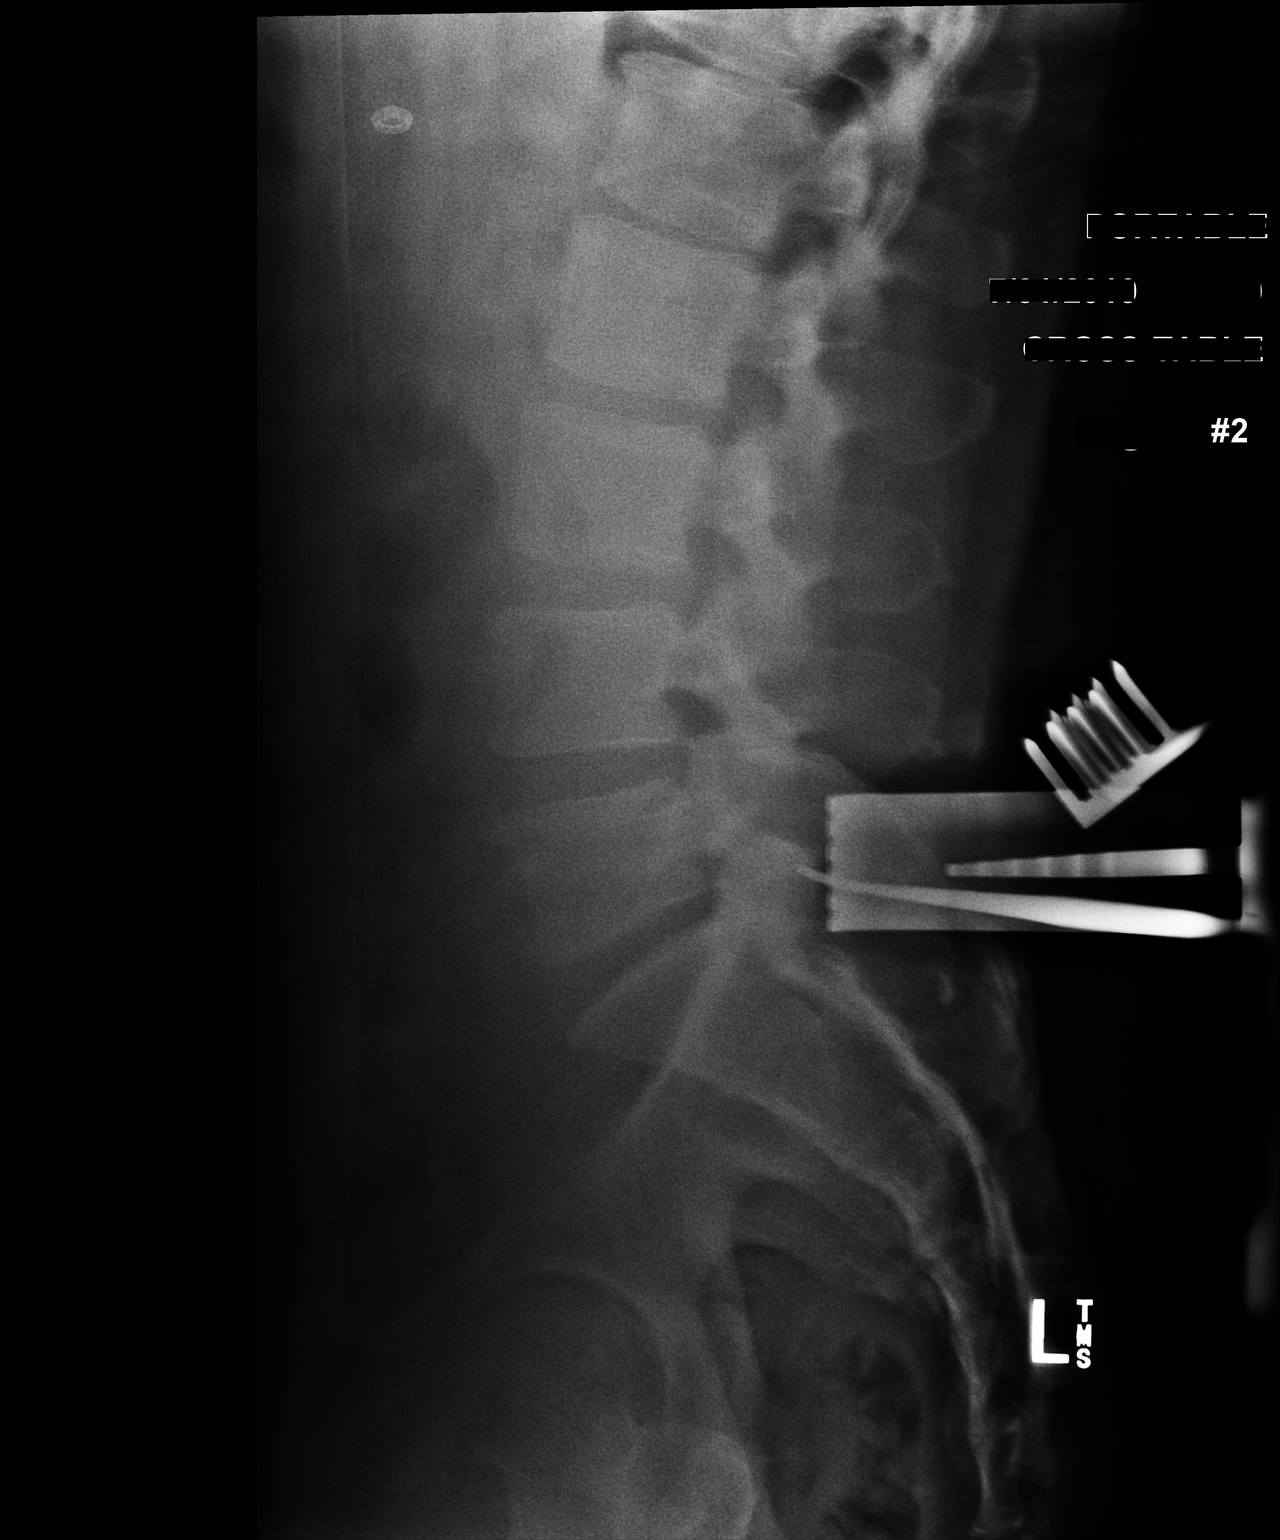

[2 of 2 positions shown; findings below may reference images not displayed]

FINDINGS: The initial image timed at [DATE] a.m. on November 14, 2017 reveals a
metallic needle projecting approximately 6.2 cm posterior to the
posterior margin of the L5-S1 disc space. The second image timed at
[DATE] a.m. on the same date reveals a tissue spreader device as well
as metal trocar overlying the L5 spinous process. The metallic
trocar lies approximately 2.4 cm from the posterior inferior margin
of the body of L5.
IMPRESSION: Two lateral lumbar radiographs for localization prior to L5-S1
laminotomy and microdiscectomy with findings as described.

## 2019-12-30 DIAGNOSIS — R739 Hyperglycemia, unspecified: Secondary | ICD-10-CM | POA: Diagnosis not present

## 2019-12-30 DIAGNOSIS — M25571 Pain in right ankle and joints of right foot: Secondary | ICD-10-CM | POA: Diagnosis not present

## 2019-12-30 DIAGNOSIS — M25572 Pain in left ankle and joints of left foot: Secondary | ICD-10-CM | POA: Diagnosis not present

## 2019-12-30 DIAGNOSIS — Z8639 Personal history of other endocrine, nutritional and metabolic disease: Secondary | ICD-10-CM | POA: Diagnosis not present

## 2020-09-28 DIAGNOSIS — J0100 Acute maxillary sinusitis, unspecified: Secondary | ICD-10-CM | POA: Diagnosis not present

## 2020-11-10 DIAGNOSIS — J0100 Acute maxillary sinusitis, unspecified: Secondary | ICD-10-CM | POA: Diagnosis not present

## 2021-02-18 DIAGNOSIS — E1165 Type 2 diabetes mellitus with hyperglycemia: Secondary | ICD-10-CM | POA: Diagnosis not present

## 2021-12-08 DIAGNOSIS — E119 Type 2 diabetes mellitus without complications: Secondary | ICD-10-CM | POA: Diagnosis not present

## 2021-12-08 DIAGNOSIS — J0100 Acute maxillary sinusitis, unspecified: Secondary | ICD-10-CM | POA: Diagnosis not present

## 2022-01-09 DIAGNOSIS — E119 Type 2 diabetes mellitus without complications: Secondary | ICD-10-CM | POA: Diagnosis not present

## 2022-01-19 DIAGNOSIS — J0100 Acute maxillary sinusitis, unspecified: Secondary | ICD-10-CM | POA: Diagnosis not present

## 2022-02-17 DIAGNOSIS — E119 Type 2 diabetes mellitus without complications: Secondary | ICD-10-CM | POA: Diagnosis not present

## 2022-03-28 DIAGNOSIS — E1100 Type 2 diabetes mellitus with hyperosmolarity without nonketotic hyperglycemic-hyperosmolar coma (NKHHC): Secondary | ICD-10-CM | POA: Diagnosis not present

## 2022-03-28 DIAGNOSIS — Z125 Encounter for screening for malignant neoplasm of prostate: Secondary | ICD-10-CM | POA: Diagnosis not present

## 2022-06-15 DIAGNOSIS — E119 Type 2 diabetes mellitus without complications: Secondary | ICD-10-CM | POA: Diagnosis not present

## 2022-06-15 DIAGNOSIS — E7800 Pure hypercholesterolemia, unspecified: Secondary | ICD-10-CM | POA: Diagnosis not present

## 2022-08-01 DIAGNOSIS — E7800 Pure hypercholesterolemia, unspecified: Secondary | ICD-10-CM | POA: Diagnosis not present

## 2022-08-01 DIAGNOSIS — E119 Type 2 diabetes mellitus without complications: Secondary | ICD-10-CM | POA: Diagnosis not present

## 2022-08-17 DIAGNOSIS — H10023 Other mucopurulent conjunctivitis, bilateral: Secondary | ICD-10-CM | POA: Diagnosis not present

## 2022-08-21 DIAGNOSIS — J020 Streptococcal pharyngitis: Secondary | ICD-10-CM | POA: Diagnosis not present

## 2022-08-21 DIAGNOSIS — R070 Pain in throat: Secondary | ICD-10-CM | POA: Diagnosis not present

## 2022-10-06 DIAGNOSIS — E119 Type 2 diabetes mellitus without complications: Secondary | ICD-10-CM | POA: Diagnosis not present

## 2022-10-06 DIAGNOSIS — E785 Hyperlipidemia, unspecified: Secondary | ICD-10-CM | POA: Diagnosis not present

## 2022-10-24 DIAGNOSIS — E782 Mixed hyperlipidemia: Secondary | ICD-10-CM | POA: Diagnosis not present

## 2022-10-24 DIAGNOSIS — E119 Type 2 diabetes mellitus without complications: Secondary | ICD-10-CM | POA: Diagnosis not present

## 2022-11-22 DIAGNOSIS — E119 Type 2 diabetes mellitus without complications: Secondary | ICD-10-CM | POA: Diagnosis not present

## 2022-11-22 DIAGNOSIS — E782 Mixed hyperlipidemia: Secondary | ICD-10-CM | POA: Diagnosis not present

## 2023-03-20 DIAGNOSIS — J0100 Acute maxillary sinusitis, unspecified: Secondary | ICD-10-CM | POA: Diagnosis not present

## 2023-04-26 DIAGNOSIS — E7800 Pure hypercholesterolemia, unspecified: Secondary | ICD-10-CM | POA: Diagnosis not present

## 2023-04-26 DIAGNOSIS — E119 Type 2 diabetes mellitus without complications: Secondary | ICD-10-CM | POA: Diagnosis not present

## 2023-05-15 DIAGNOSIS — G4710 Hypersomnia, unspecified: Secondary | ICD-10-CM | POA: Diagnosis not present

## 2023-06-16 DIAGNOSIS — G4733 Obstructive sleep apnea (adult) (pediatric): Secondary | ICD-10-CM | POA: Diagnosis not present

## 2023-06-18 DIAGNOSIS — G4733 Obstructive sleep apnea (adult) (pediatric): Secondary | ICD-10-CM | POA: Diagnosis not present

## 2023-06-18 DIAGNOSIS — G4710 Hypersomnia, unspecified: Secondary | ICD-10-CM | POA: Diagnosis not present

## 2023-06-18 DIAGNOSIS — Z6834 Body mass index (BMI) 34.0-34.9, adult: Secondary | ICD-10-CM | POA: Diagnosis not present

## 2023-06-18 DIAGNOSIS — E66811 Obesity, class 1: Secondary | ICD-10-CM | POA: Diagnosis not present

## 2023-07-19 DIAGNOSIS — G4733 Obstructive sleep apnea (adult) (pediatric): Secondary | ICD-10-CM | POA: Diagnosis not present

## 2023-08-18 DIAGNOSIS — G4733 Obstructive sleep apnea (adult) (pediatric): Secondary | ICD-10-CM | POA: Diagnosis not present

## 2023-09-18 DIAGNOSIS — G4733 Obstructive sleep apnea (adult) (pediatric): Secondary | ICD-10-CM | POA: Diagnosis not present

## 2023-10-18 DIAGNOSIS — G4733 Obstructive sleep apnea (adult) (pediatric): Secondary | ICD-10-CM | POA: Diagnosis not present

## 2023-11-01 DIAGNOSIS — E119 Type 2 diabetes mellitus without complications: Secondary | ICD-10-CM | POA: Diagnosis not present

## 2023-11-01 DIAGNOSIS — E785 Hyperlipidemia, unspecified: Secondary | ICD-10-CM | POA: Diagnosis not present

## 2023-11-18 DIAGNOSIS — G4733 Obstructive sleep apnea (adult) (pediatric): Secondary | ICD-10-CM | POA: Diagnosis not present

## 2024-02-07 DIAGNOSIS — E785 Hyperlipidemia, unspecified: Secondary | ICD-10-CM | POA: Diagnosis not present

## 2024-02-07 DIAGNOSIS — E1165 Type 2 diabetes mellitus with hyperglycemia: Secondary | ICD-10-CM | POA: Diagnosis not present

## 2024-02-25 DIAGNOSIS — L821 Other seborrheic keratosis: Secondary | ICD-10-CM | POA: Diagnosis not present

## 2024-02-25 DIAGNOSIS — L820 Inflamed seborrheic keratosis: Secondary | ICD-10-CM | POA: Diagnosis not present

## 2024-02-25 DIAGNOSIS — D224 Melanocytic nevi of scalp and neck: Secondary | ICD-10-CM | POA: Diagnosis not present

## 2024-02-25 DIAGNOSIS — L918 Other hypertrophic disorders of the skin: Secondary | ICD-10-CM | POA: Diagnosis not present
